# Patient Record
Sex: Male | Born: 1990 | Race: Black or African American | Hispanic: No | Marital: Single | Smoking: Never smoker
Health system: Southern US, Community
[De-identification: ages and names within clinical notes are randomized; demographics above are authoritative.]

---

## 2012-04-30 ENCOUNTER — Emergency Department (HOSPITAL_COMMUNITY)
Admission: EM | Admit: 2012-04-30 | Discharge: 2012-04-30 | Disposition: A | Discharge status: Home / Self Care | Payer: Self-pay | Attending: Emergency Medicine | Admitting: Emergency Medicine

## 2012-04-30 ENCOUNTER — Emergency Department (HOSPITAL_COMMUNITY): Payer: Self-pay

## 2012-04-30 ENCOUNTER — Encounter (HOSPITAL_COMMUNITY): Payer: Self-pay | Admitting: Emergency Medicine

## 2012-04-30 DIAGNOSIS — S93306A Unspecified dislocation of unspecified foot, initial encounter: Secondary | ICD-10-CM

## 2012-04-30 DIAGNOSIS — S9306XA Dislocation of unspecified ankle joint, initial encounter: Secondary | ICD-10-CM | POA: Insufficient documentation

## 2012-04-30 DIAGNOSIS — W108XXA Fall (on) (from) other stairs and steps, initial encounter: Secondary | ICD-10-CM | POA: Insufficient documentation

## 2012-04-30 DIAGNOSIS — Y92009 Unspecified place in unspecified non-institutional (private) residence as the place of occurrence of the external cause: Secondary | ICD-10-CM | POA: Insufficient documentation

## 2012-04-30 MED ORDER — SODIUM CHLORIDE 0.9 % IV SOLN
Freq: Once | INTRAVENOUS | Status: AC
  Administered 2012-04-30: 03:00:00 via INTRAVENOUS

## 2012-04-30 MED ORDER — PROPOFOL 10 MG/ML IV BOLUS
INTRAVENOUS | Status: AC | PRN
  Administered 2012-04-30 (×2): 50 mg via INTRAVENOUS

## 2012-04-30 MED ORDER — PROPOFOL 10 MG/ML IV BOLUS
0.5000 mg/kg | Freq: Once | INTRAVENOUS | Status: AC
  Administered 2012-04-30: 50 mg via INTRAVENOUS
  Filled 2012-04-30: qty 20

## 2012-04-30 MED ORDER — HYDROMORPHONE HCL PF 1 MG/ML IJ SOLN
1.0000 mg | Freq: Once | INTRAMUSCULAR | Status: AC
  Administered 2012-04-30: 1 mg via INTRAVENOUS
  Filled 2012-04-30: qty 1

## 2012-04-30 MED ORDER — OXYCODONE-ACETAMINOPHEN 5-325 MG PO TABS: 1.0000 | ORAL_TABLET | Freq: Four times a day (QID) | ORAL | Status: AC | PRN

## 2012-04-30 NOTE — Progress Notes (Signed)
Orthopedic Tech Progress Note Patient Details:  Walter Alvarez March 05, 1991 [id number]  Ortho Devices Type of Ortho Device: Crutches;Short leg splint   Haskell Flirt 04/30/2012, 7:38 AM

## 2012-04-30 NOTE — Progress Notes (Signed)
Orthopedic Tech Progress Note Patient Details:  Walter Alvarez [birth year] [id number]  Patient ID: Walter Alvarez, male   DOB: 1991-04-22, 21 y.o.   MRN: [id number] Rcvd call for crutches and LE splint. Upon arrival, noticed pt already had crutches and splint.    Royer Cristobal T 04/30/2012, 7:42 AM

## 2012-04-30 NOTE — ED Notes (Signed)
Report given to Nada Maclachlan Josh NT.

## 2012-04-30 NOTE — ED Provider Notes (Signed)
History     CSN: [id number]  Arrival date & time 04/30/12  0109   First MD Initiated Contact with Patient 04/30/12 [phone]      Chief Complaint  Patient presents with  . Foot Injury    (Consider location/radiation/quality/duration/timing/severity/associated sxs/prior treatment) HPI Comments: Walking down stairs  felt a POP now with obvious deformity   Patient is a 21 y.o. male presenting with foot injury. The history is provided by the patient.  Foot Injury  The incident occurred 3 to 5 hours ago. The incident occurred at home. The injury mechanism was a fall. Pertinent negatives include no numbness.    No past medical history on file.  No past surgical history on file.  No family history on file.  History  Substance Use Topics  . Smoking status: Not on file  . Smokeless tobacco: Not on file  . Alcohol Use: Not on file      Review of Systems  Musculoskeletal: Positive for joint swelling.  Skin: Negative for wound.  Neurological: Negative for dizziness, weakness and numbness.    Allergies  Review of patient's allergies indicates no known allergies.  Home Medications  No current outpatient prescriptions on file.  BP 104/43  Pulse 85  Temp 98.9 F (37.2 C) (Oral)  Resp 18  SpO2 94%  Physical Exam  Constitutional: He is oriented to person, place, and time. He appears well-developed and well-nourished.  HENT:  Head: Normocephalic.  Eyes: Pupils are equal, round, and reactive to light.  Neck: Normal range of motion.  Cardiovascular: Normal rate.   Pulmonary/Chest: Effort normal.  Musculoskeletal: He exhibits tenderness.       Right shoulder: He exhibits decreased range of motion, tenderness and deformity.       Deformity of L foot at heel   Neurological: He is alert and oriented to person, place, and time.  Skin: Skin is warm and dry.    ED Course  ORTHOPEDIC INJURY TREATMENT Date/Time: 04/30/2012 6:00 AM Performed by: Arman Filter Authorized by:  Earley Favor K Risks and benefits: risks, benefits and alternatives were discussed Time out: Immediately prior to procedure a "time out" was called to verify the correct patient, procedure, equipment, support staff and site/side marked as required. Patient tolerance: Patient tolerated the procedure well with no immediate complications.   (including critical care time)  Labs Reviewed - No data to display Dg Ankle 2 Views Left  04/30/2012  *RADIOLOGY REPORT*  Clinical Data: Twisted ankle, internal rotation deformity  LEFT ANKLE - 2 VIEW  Comparison: None.  Findings: The ankle mortise is not well evaluated.  There is a dislocation which may be at the calcaneal talar level.  IMPRESSION: Potential calcaneal talar dislocation.  Ankle mortise not well evaluated.  Consider CT ankle for further evaluation.   Original Report Authenticated By: Genevive Bi, M.D.      No diagnosis found.    MDM  Dislocation  Reduction preformed by Dr. Rubin Payor  \         Arman Filter, NP 04/30/12 [phone]

## 2012-04-30 NOTE — ED Notes (Signed)
Pt stated that he was walking and fell down one step. Left ankle in deformed and inward. Pulses intact. Foot is warm to touch. Pain is 9 out of 10. Brisk capillary refill. Will continue to monitor.

## 2012-04-30 NOTE — ED Provider Notes (Addendum)
Medical screening examination/treatment/procedure(s) were conducted as a shared visit with non-physician practitioner(s) and myself.  I personally evaluated the patient during the encounter.  Walter Alvarez. Rubin Payor, MD 04/30/12 [phone]  Procedural sedation Performed by: Billee Cashing. Consent: Verbal consent obtained. Risks and benefits: risks, benefits and alternatives were discussed Required items: required blood products, implants, devices, and special equipment available Patient identity confirmed: arm band and provided demographic data Time out: Immediately prior to procedure a "time out" was called to verify the correct patient, procedure, equipment, support staff and site/side marked as required.  Sedation type: moderate (conscious) sedation NPO time confirmed and considedered  Sedatives: PROPOFOL  Physician Time at Bedside: 10 minutes  Vitals: Vital signs were monitored during sedation. Cardiac Monitor, pulse oximeter Patient tolerance: Patient tolerated the procedure well with no immediate complications. Comments: Pt with uneventful recovered. Returned to pre-procedural sedation baseline    Procedural note. Procedure, closed reduction of left calcaneal talar dislocation. Patient was sedated using propofol.  Performed by myself. Verbal consent was obtained. Risks and benefits were discussed patient identity was confirmed By armband and by asking him. Time out was called. Patient's was sedated and left foot dislocation was reduced with manipulation. Near-anatomic alignment was maintained and patient was splinted by orthopedic tech. X-ray showed reduction of dislocation. Neurovascular intact and will followup with Dr.        [name]. Rubin Payor, MD 04/30/12 [phone]

## 2012-04-30 NOTE — ED Notes (Signed)
Pt reports ETOH; tripped and hurt left foot and he is reporting pain in that foot. Foot is mildly swollen and pulses intact.

## 2015-12-01 ENCOUNTER — Emergency Department (HOSPITAL_COMMUNITY): Payer: Self-pay

## 2015-12-01 ENCOUNTER — Emergency Department (HOSPITAL_COMMUNITY)
Admission: EM | Admit: 2015-12-01 | Discharge: 2015-12-02 | Disposition: A | Discharge status: Home / Self Care | Payer: Self-pay | Attending: Emergency Medicine | Admitting: Emergency Medicine

## 2015-12-01 ENCOUNTER — Encounter (HOSPITAL_COMMUNITY): Payer: Self-pay | Admitting: Emergency Medicine

## 2015-12-01 DIAGNOSIS — W01198A Fall on same level from slipping, tripping and stumbling with subsequent striking against other object, initial encounter: Secondary | ICD-10-CM | POA: Insufficient documentation

## 2015-12-01 DIAGNOSIS — S80212A Abrasion, left knee, initial encounter: Secondary | ICD-10-CM | POA: Insufficient documentation

## 2015-12-01 DIAGNOSIS — Y998 Other external cause status: Secondary | ICD-10-CM | POA: Insufficient documentation

## 2015-12-01 DIAGNOSIS — S8002XA Contusion of left knee, initial encounter: Secondary | ICD-10-CM | POA: Insufficient documentation

## 2015-12-01 DIAGNOSIS — Y9389 Activity, other specified: Secondary | ICD-10-CM | POA: Insufficient documentation

## 2015-12-01 DIAGNOSIS — Y92009 Unspecified place in unspecified non-institutional (private) residence as the place of occurrence of the external cause: Secondary | ICD-10-CM | POA: Insufficient documentation

## 2015-12-01 NOTE — Discharge Instructions (Signed)
Wear Ace bandage as applied for the next several days. Keep your leg elevated as often as possible.  Ice for 20 minutes every 2 hours while awake for the next 2 days.  Ibuprofen 600 mg every 6 hours as needed for pain.  Follow-up with your primary Dr. if you're not improving in the next week.   Contusion A contusion is a deep bruise. Contusions are the result of a blunt injury to tissues and muscle fibers under the skin. The injury causes bleeding under the skin. The skin overlying the contusion may turn blue, purple, or yellow. Minor injuries will give you a painless contusion, but more severe contusions may stay painful and swollen for a few weeks.  CAUSES  This condition is usually caused by a blow, trauma, or direct force to an area of the body. SYMPTOMS  Symptoms of this condition include:  Swelling of the injured area.  Pain and tenderness in the injured area.  Discoloration. The area may have redness and then turn blue, purple, or yellow. DIAGNOSIS  This condition is diagnosed based on a physical exam and medical history. An X-ray, CT scan, or MRI may be needed to determine if there are any associated injuries, such as broken bones (fractures). TREATMENT  Specific treatment for this condition depends on what area of the body was injured. In general, the best treatment for a contusion is resting, icing, applying pressure to (compression), and elevating the injured area. This is often called the RICE strategy. Over-the-counter anti-inflammatory medicines may also be recommended for pain control.  HOME CARE INSTRUCTIONS   Rest the injured area.  If directed, apply ice to the injured area:  Put ice in a plastic bag.  Place a towel between your skin and the bag.  Leave the ice on for 20 minutes, 2-3 times per day.  If directed, apply light compression to the injured area using an elastic bandage. Make sure the bandage is not wrapped too tightly. Remove and reapply the bandage  as directed by your health care provider.  If possible, raise (elevate) the injured area above the level of your heart while you are sitting or lying down.  Take over-the-counter and prescription medicines only as told by your health care provider. SEEK MEDICAL CARE IF:  Your symptoms do not improve after several days of treatment.  Your symptoms get worse.  You have difficulty moving the injured area. SEEK IMMEDIATE MEDICAL CARE IF:   You have severe pain.  You have numbness in a hand or foot.  Your hand or foot turns pale or cold.   This information is not intended to replace advice given to you by your health care provider. Make sure you discuss any questions you have with your health care provider.   Document Released: 04/28/2005 Document Revised: 04/09/2015 Document Reviewed: 12/04/2014 Elsevier Interactive Patient Education [employer]2016 [employer]

## 2015-12-01 NOTE — ED Notes (Signed)
Pt declined ice for injured extremity.

## 2015-12-01 NOTE — ED Provider Notes (Signed)
CSN: [id number]     Arrival date & time 12/01/15  2140 History  By signing my name below, I, [facility], attest that this documentation has been prepared under the direction and in the presence of Geoffery Lyons, MD. Electronically Signed: Randell Patient, ED Scribe. 12/01/2015. 11:58 PM.   Chief Complaint  Patient presents with  . Leg Injury   HPI Comments: Walter Alvarez is a 25 y.o. male who presents to the Emergency Department complaining of constant, moderate left knee pain onset after a fall that occurred earlier today. Pt reports that he was renovating his home when he stepped on unstable flooring causing him to slide sideways, and fall through the boards, striking his knee on the floor, followed immediately by pain and swelling of his left knee. Pain is worse with palpation. He has been able to ambulate without difficulty despite pain. Denies any other symptoms currently.   The history is provided by the patient. No language interpreter was used.    History reviewed. No pertinent past medical history. History reviewed. No pertinent past surgical history. No family history on file. Social History  Substance Use Topics  . Smoking status: Never Smoker   . Smokeless tobacco: Never Used  . Alcohol Use: Yes     Comment: drinks 4 drinks twice a week    Review of Systems  Musculoskeletal: Positive for joint swelling and arthralgias.  All other systems reviewed and are negative.     Allergies  Review of patient's allergies indicates no known allergies.  Home Medications   Prior to Admission medications   Medication Sig Start Date End Date Taking? Authorizing Provider  oxyCODONE-acetaminophen (PERCOCET/ROXICET) 5-325 MG per tablet Take 1-2 tablets by mouth every 6 (six) hours as needed for pain. 04/30/12   Benjiman Core, MD   BP 144/82 mmHg  Pulse 92  Temp(Src) 98.6 F (37 C) (Oral)  Resp 18  Ht  (1.676 m)  Wt 220 lb (99.791 kg)  BMI 35.53 kg/m2  SpO2  100% Physical Exam  Constitutional: He is oriented to person, place, and time. He appears well-developed and well-nourished.  HENT:  Head: Normocephalic.  Eyes: EOM are normal.  Neck: Normal range of motion.  Pulmonary/Chest: Effort normal.  Abdominal: He exhibits no distension.  Musculoskeletal: Normal range of motion.  There is an abrasion and swelling to the medial and inferior aspect of the left knee. There is no effusion. He has pain with ROM, however there is no instability anteriorly/posteriorly, or with varus or valgus stress.  Neurological: He is alert and oriented to person, place, and time.  Psychiatric: He has a normal mood and affect.  Nursing note and vitals reviewed.   ED Course  Procedures   DIAGNOSTIC STUDIES: Oxygen Saturation is 100% on RA, normal by my interpretation.    COORDINATION OF CARE: 11:55 PM Discussed results of advanced imaging. Will wrap area and order crutches.  Discussed treatment plan with pt at bedside and pt agreed to plan.   Imaging Review Dg Tibia/fibula Left  12/01/2015  CLINICAL DATA:  Post fall through for today now with pain involving the medial aspect of the knee radiating to the ankle. EXAM: LEFT TIBIA AND FIBULA - 2 VIEW COMPARISON:  Left knee radiographs-earlier same day FINDINGS: No fracture or dislocation. Regional soft tissues appear normal. No radiopaque foreign body. IMPRESSION: No fracture radiopaque foreign body. Electronically Signed   By: Simonne Come M.D.   On: 12/01/2015 22:44   Dg Knee Complete 4 Views Left  12/01/2015  CLINICAL DATA:  Post fall today, now with medial sided knee pain. EXAM: LEFT KNEE - COMPLETE 4+ VIEW COMPARISON:  Left tibia and fibular radiographs-earlier same day FINDINGS: There is apparent mild soft tissue swelling involving the caudal anterior aspect of the knee. No radiopaque foreign body. No fracture or dislocation. Left knee joint spaces are preserved. No knee joint effusion. IMPRESSION: Soft tissue  swelling involving the anterior caudal aspect of the knee without associated fracture or radiopaque foreign body. Electronically Signed   By: Simonne ComeJohn  Watts M.D.   On: 12/01/2015 22:46   I have personally reviewed and evaluated these images as part of my medical decision-making.   MDM   Final diagnoses:  Knee contusion, left, initial encounter    Patient presents with complaints of left knee and upper tibia pain after injuring his leg well on a construction site. His x-rays are negative for fracture but do reveal soft tissue swelling. This will be treated as a contusion with an Ace bandage, crutches, ice, and rest. He is to follow-up with his primary Dr. if not improving in the next week.  I personally performed the services described in this documentation, which was scribed in my presence. The recorded information has been reviewed and is accurate.        Geoffery Lyonsouglas Kalid Ghan, MD 12/04/15 [phone]

## 2015-12-01 NOTE — ED Notes (Signed)
Pt reports left leg injury occuring while renovating his home.  Pt states he stepped through the flooring as it was not stable.  Pt claims to have hit his shin on the boards as he fell.  Reporting sharp pains to that area.  Pt states he is able to walk with discomfort.

## 2015-12-02 NOTE — ED Notes (Signed)
Pt reports understanding of discharge information. No questions at time of discharge 

## 2017-01-14 IMAGING — CR DG TIBIA/FIBULA 2V*L*
2 series · 2 of 2 positions shown · non-contrast
Comparison: Left knee radiographs-earlier same day

CLINICAL DATA: Post fall through for today now with pain involving
the medial aspect of the knee radiating to the ankle.

EXAM:
LEFT TIBIA AND FIBULA - 2 VIEW

[t tib-fib ap left]
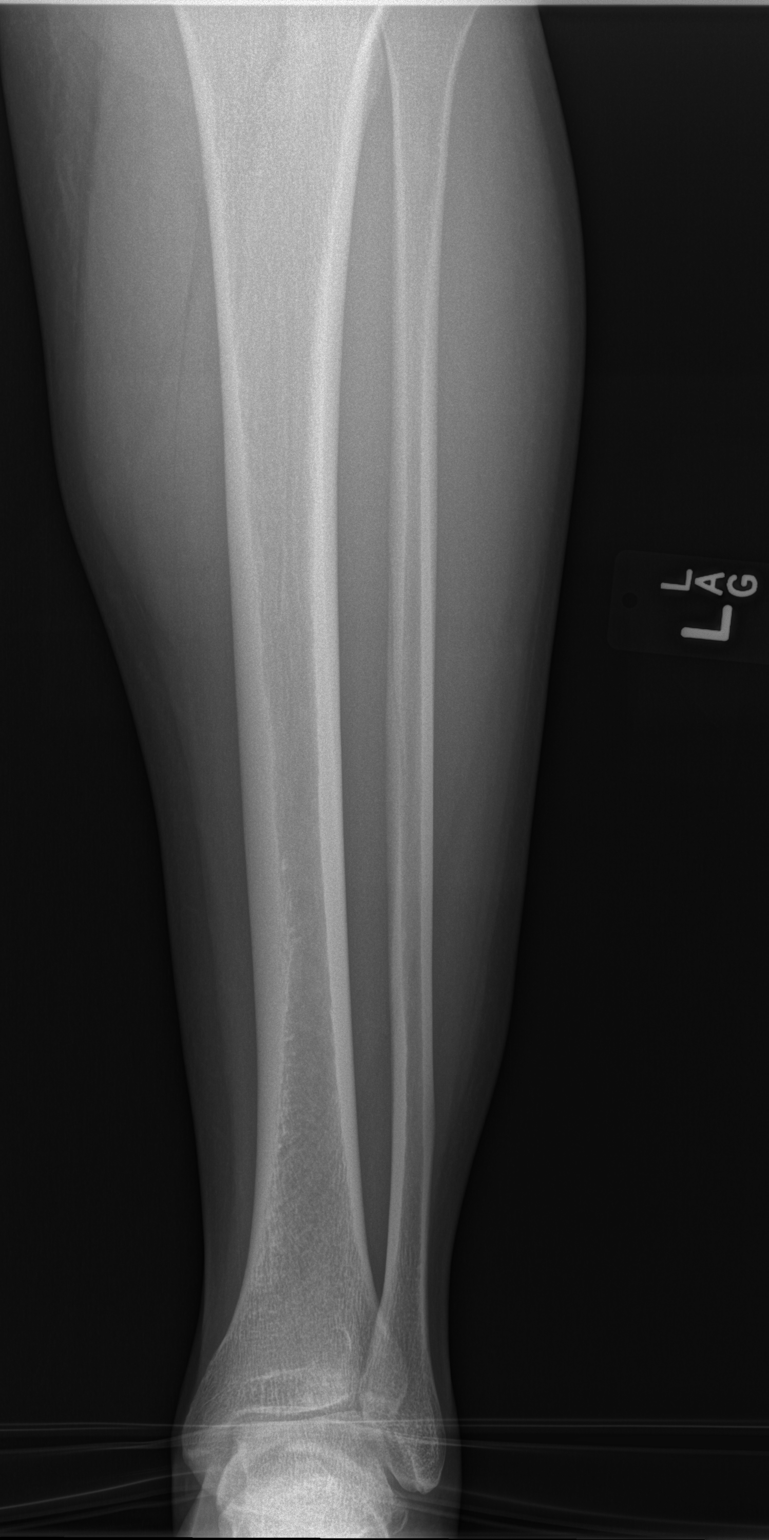

[t tib-fib lat left]
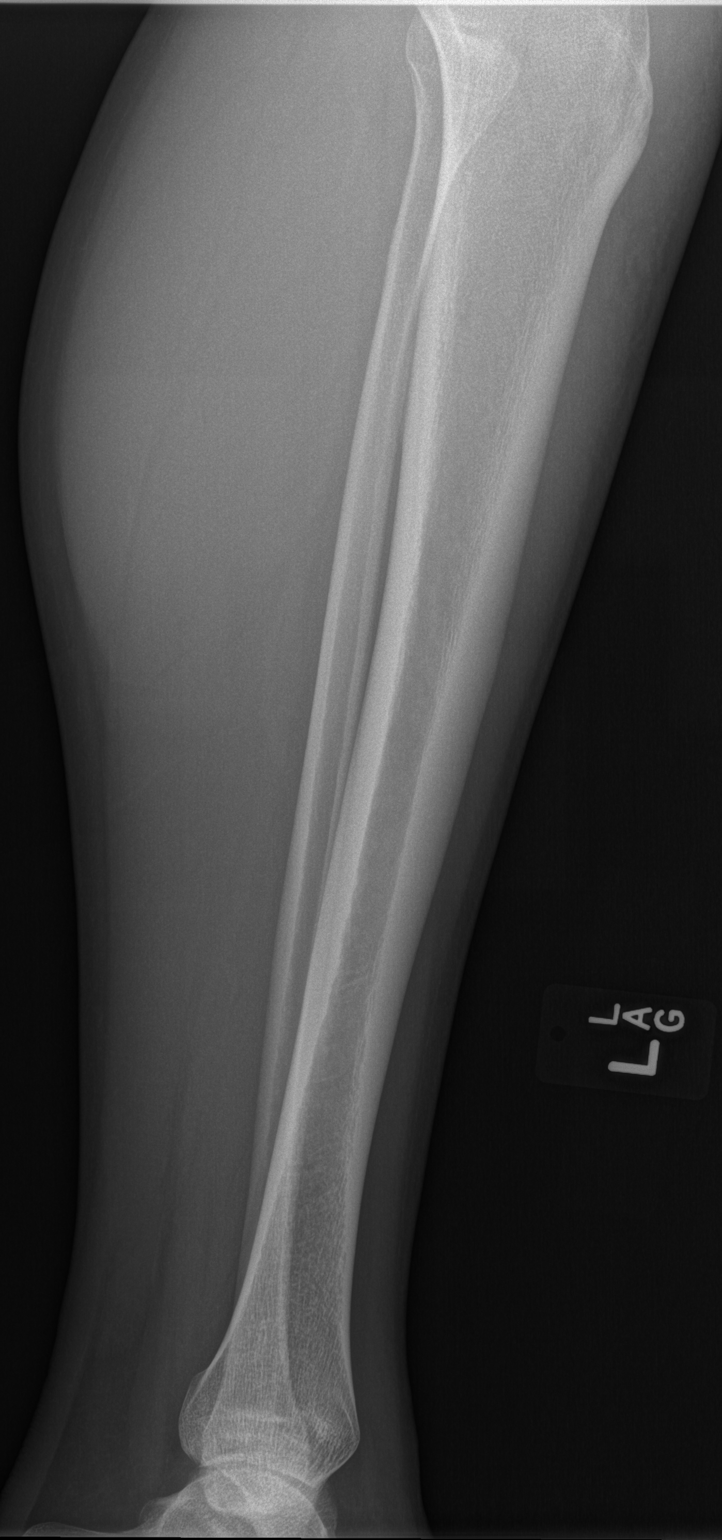

[2 of 2 positions shown; findings below may reference images not displayed]

FINDINGS: No fracture or dislocation. Regional soft tissues appear normal. No
radiopaque foreign body.
IMPRESSION: No fracture radiopaque foreign body.
